# Patient Record
Sex: Female | Born: 1975 | Race: White | Hispanic: No | Marital: Single | Smoking: Never smoker
Health system: Southern US, Community
[De-identification: ages and names within clinical notes are randomized; demographics above are authoritative.]

## PROBLEM LIST (undated history)

## (undated) DIAGNOSIS — J45909 Unspecified asthma, uncomplicated: Secondary | ICD-10-CM

## (undated) DIAGNOSIS — R1011 Right upper quadrant pain: Secondary | ICD-10-CM

## (undated) HISTORY — DX: Unspecified asthma, uncomplicated: J45.909

## (undated) HISTORY — PX: NASAL SINUS SURGERY: SHX719

---

## 2001-12-22 ENCOUNTER — Ambulatory Visit (HOSPITAL_COMMUNITY): Admission: RE | Admit: 2001-12-22 | Discharge: 2001-12-22 | Payer: Self-pay | Admitting: Pulmonary Disease

## 2001-12-22 ENCOUNTER — Encounter: Payer: Self-pay | Admitting: Pulmonary Disease

## 2002-05-28 ENCOUNTER — Encounter: Payer: Self-pay | Admitting: Otolaryngology

## 2002-05-28 ENCOUNTER — Encounter: Admission: RE | Admit: 2002-05-28 | Discharge: 2002-05-28 | Payer: Self-pay | Admitting: Otolaryngology

## 2002-06-14 ENCOUNTER — Ambulatory Visit (HOSPITAL_BASED_OUTPATIENT_CLINIC_OR_DEPARTMENT_OTHER): Admission: RE | Admit: 2002-06-14 | Discharge: 2002-06-14 | Payer: Self-pay | Admitting: Otolaryngology

## 2002-06-14 ENCOUNTER — Encounter (INDEPENDENT_AMBULATORY_CARE_PROVIDER_SITE_OTHER): Payer: Self-pay | Admitting: Specialist

## 2004-06-16 ENCOUNTER — Encounter: Admission: RE | Admit: 2004-06-16 | Discharge: 2004-06-16 | Payer: Self-pay | Admitting: Family Medicine

## 2006-05-30 IMAGING — US US EXTREM LOW VENOUS*L*
1 series · 14 of 24 positions shown · non-contrast
Comparison: none

CLINICAL DATA: Left leg pain and swelling, question DVT.
 ULTRASOUND VENOUS IMAGING, UNILATERAL LEFT:
 The deep veins of the left thigh were evaluated from groin to popliteal fossa using compression grayscale technique, color and pulse Doppler.  The vessels are compressible throughout the course.  There is normal blood flow with color Doppler and normal augmentation.

[Series 1: unknown · 14 of 29 slices shown]
[im 1/29]
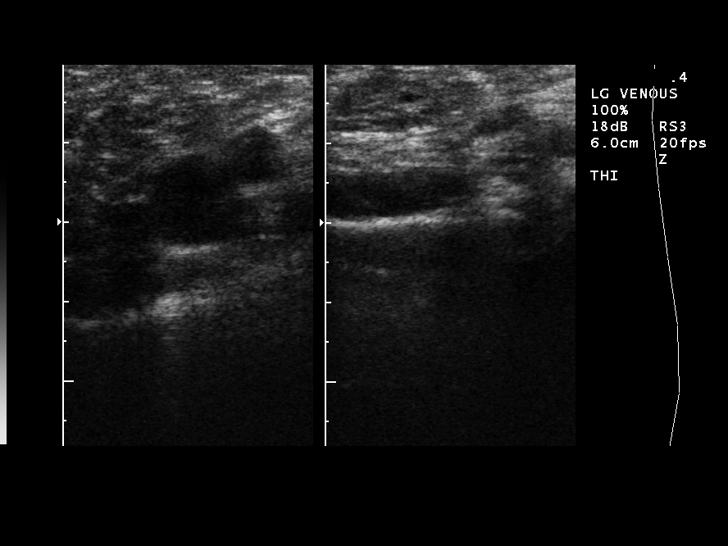
[im 3/29]
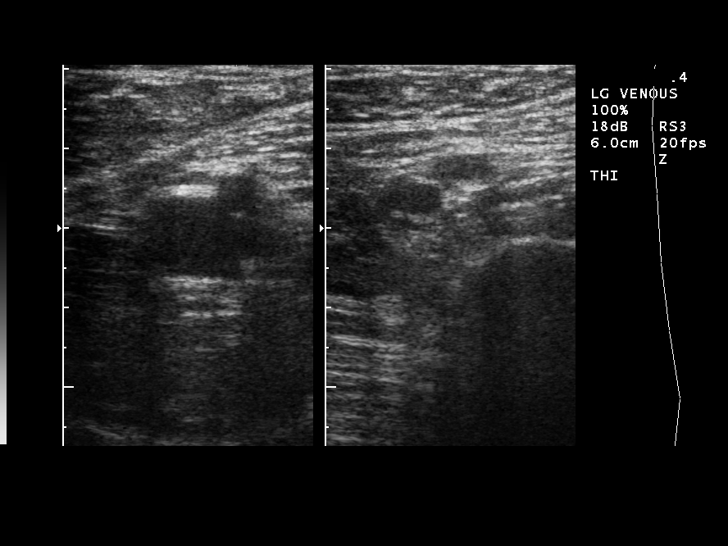
[im 5/29]
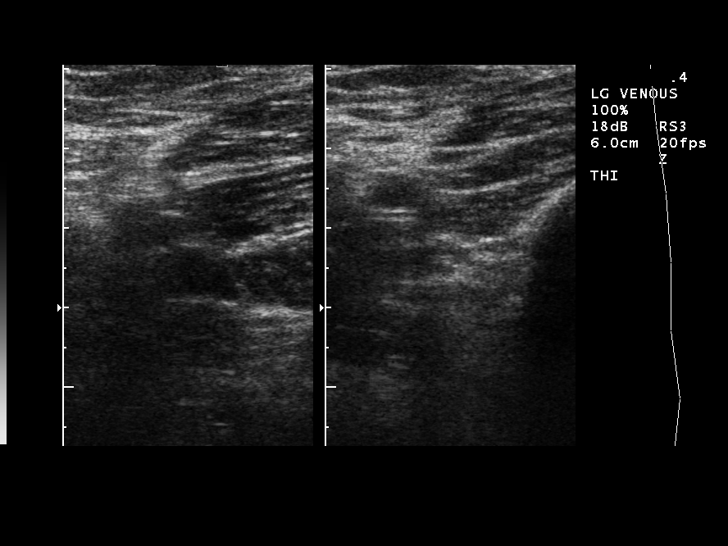
[im 8/29]
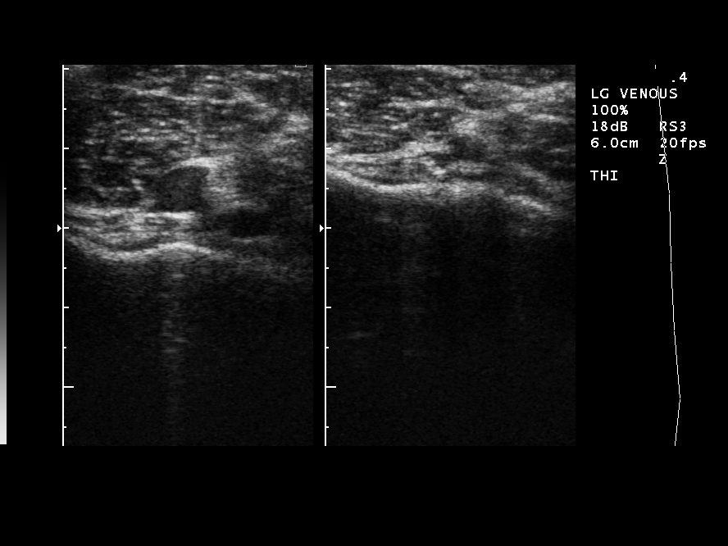
[im 9/29]
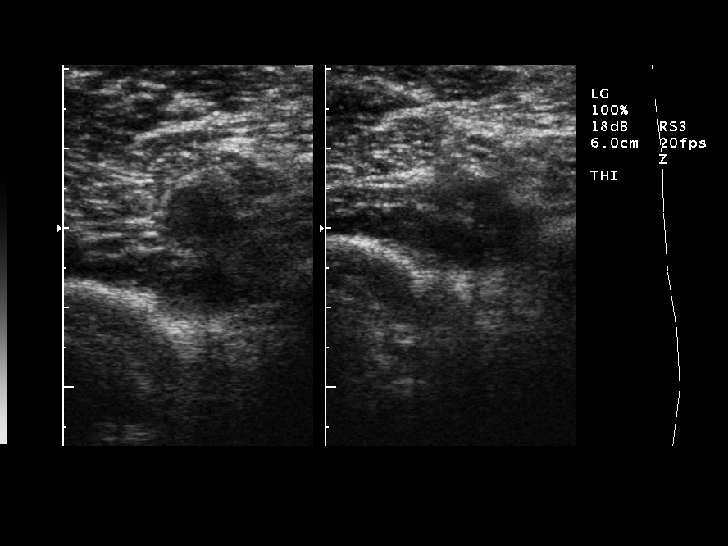
[im 11/29]
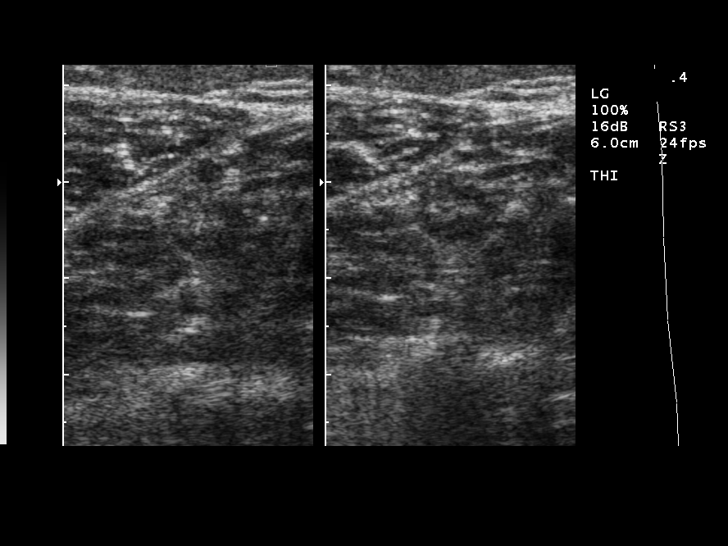
[im 14/29]
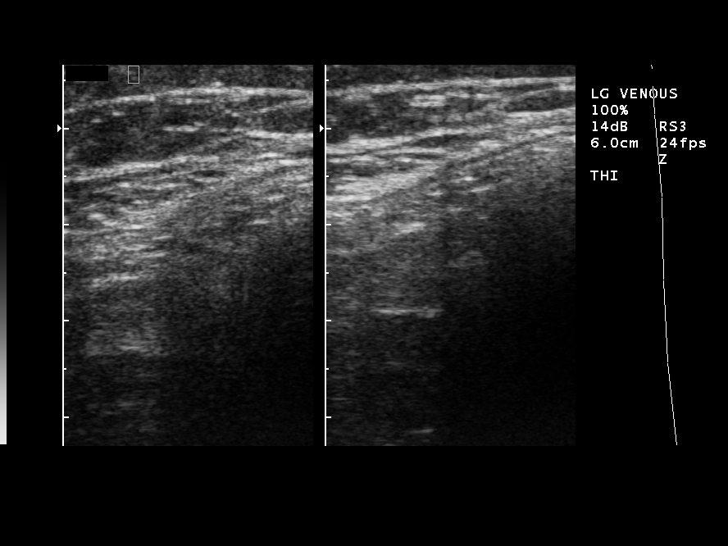
[im 15/29]
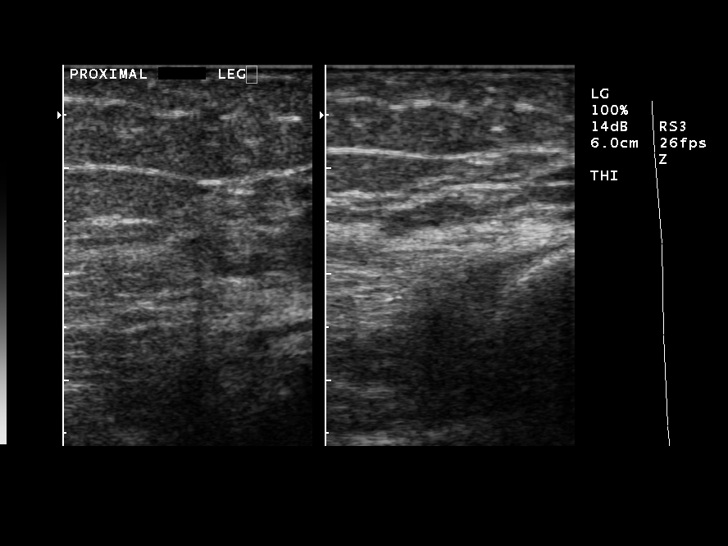
[im 18/29]
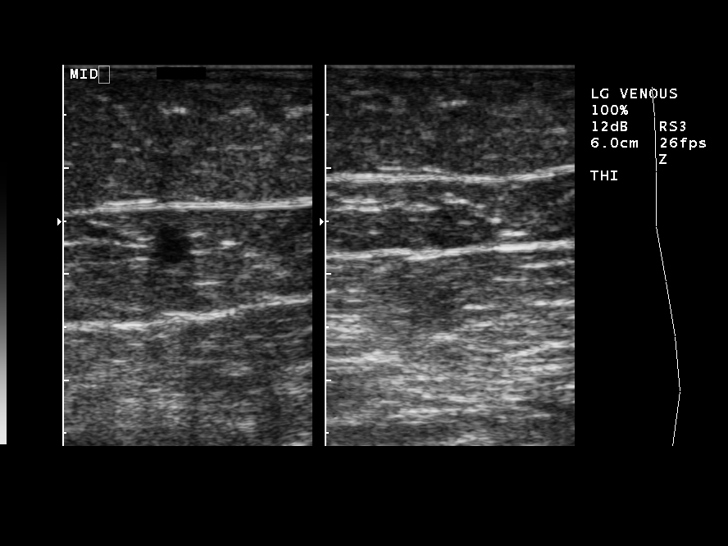
[im 20/29]
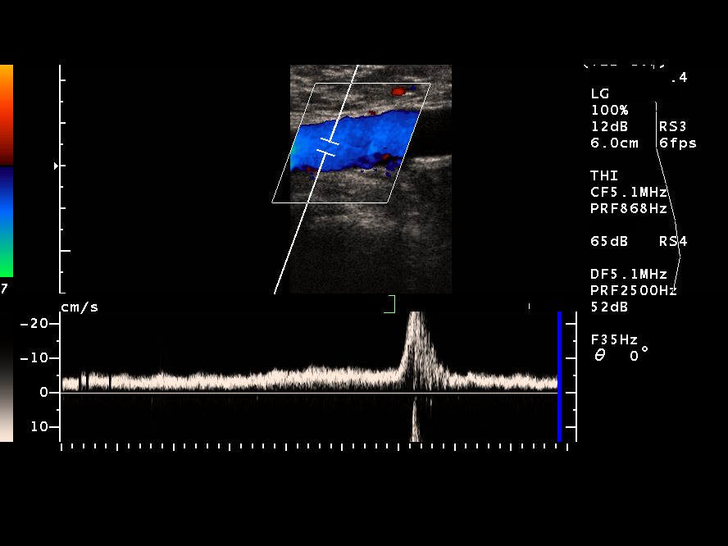
[im 22/29]
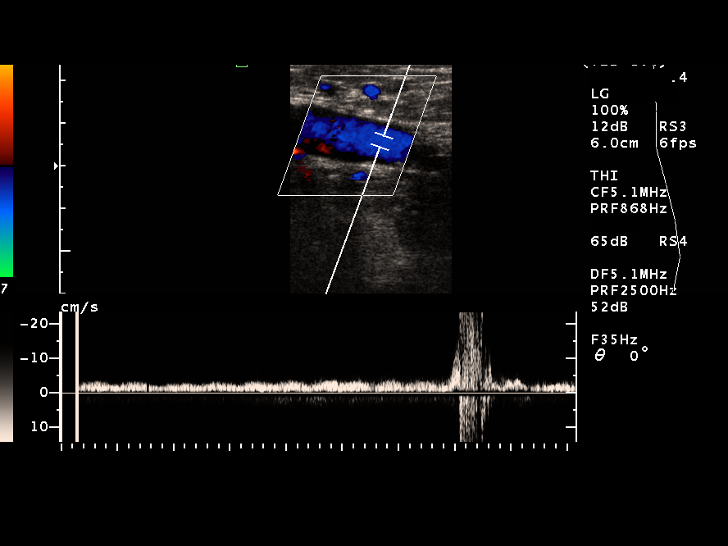
[im 24/29]
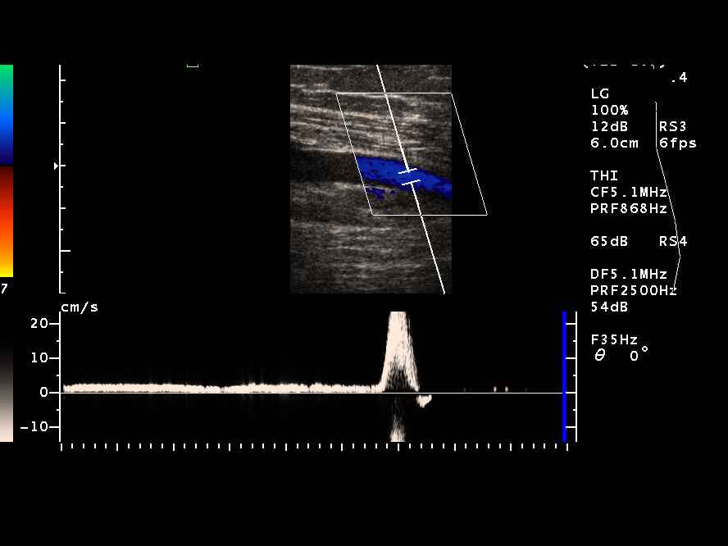
[im 26/29]
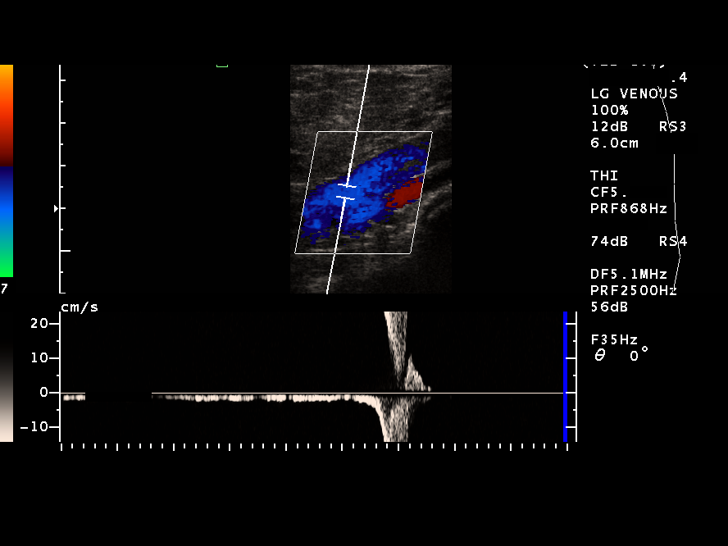
[im 29/29]
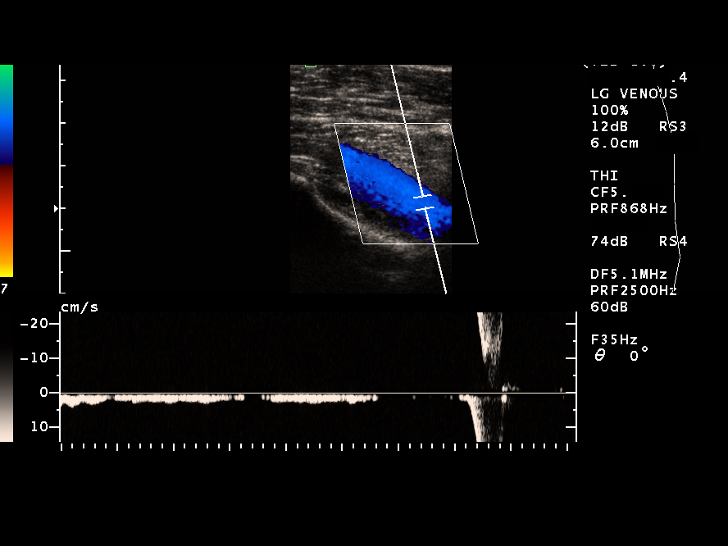

[14 of 24 positions shown; findings below may reference images not displayed]

IMPRESSION: No evidence of acute lower extremity DVT from groin to popliteal fossa on the left.

## 2009-08-21 ENCOUNTER — Ambulatory Visit: Payer: Self-pay | Admitting: Diagnostic Radiology

## 2009-08-21 ENCOUNTER — Emergency Department (HOSPITAL_BASED_OUTPATIENT_CLINIC_OR_DEPARTMENT_OTHER): Admission: EM | Admit: 2009-08-21 | Discharge: 2009-08-21 | Payer: Self-pay | Admitting: Emergency Medicine

## 2010-12-11 NOTE — Op Note (Signed)
Kathleen Jacobs, Kathleen Jacobs                         ACCOUNT NO.:  [id number]   MEDICAL RECORD NO.:  [id number]                   PATIENT TYPE:  AMB   LOCATION:  DSC                                  FACILITY:  [facility]   PHYSICIAN:  Suzanna Obey, M.D.                    DATE OF BIRTH:  07/22/1976   DATE OF PROCEDURE:  DATE OF DISCHARGE:                                 OPERATIVE REPORT   PREOPERATIVE DIAGNOSES:  1. Chronic sinusitis.  2. Deviated septum.  3. Turbinate hypertrophy.   POSTOPERATIVE DIAGNOSES:  1. Chronic sinusitis.  2. Deviated septum.  3. Turbinate hypertrophy.   SURGICAL PROCEDURE:  Septoplasty, submucous resection of inferior  turbinates, bilateral maxillary antrostomy, bilateral ethmoidectomy, right  concha bullosa removal and InstaTrak computer guidance.   ANESTHESIA:  General endotracheal tube.   ESTIMATED BLOOD LOSS:  25 cc.   INDICATIONS FOR PROCEDURE:  This is a 35 year old who has had chronic  problems with asthma and sinus infections.  She has chronic nasal  obstruction.  She has repetitive sinus infections and has become quite  frustrated with the failure to respond.  She has CT scan evidence that she  has bilateral ethmoid disease and maxillary sinus with probably polyps in  the sinus.  She has a severely deviated septum to the right and turbinate  hypertrophy.  She was informed of the risks and benefits of the procedure  including bleeding, infection, perforation, change in the external  appearance of the nose, CSF leak, change in the sense of smell, blindness,  scarring of the sinuses, and risk of the anesthetic.  All questions were  answered and consent was obtained.   DESCRIPTION OF PROCEDURE:  The patient was taken to the operating room and  placed in the supine position after adequate general endotracheal tube  anesthesia.  Was positioned in the supine position and the InstaTrak helmet  was placed.  She was prepped in the usual sterile manner with  draping.  The  oxymetazoline pledgets were placed into the nose bilaterally and then the  septum, inferior turbinate and middle turbinates were injected with 1%  lidocaine with 1:100,000 epinephrine.  A right hemitransfixion incision was  started with raising the mucoperichondrial and ostial flap.  The cartilage  was divided about 2 cm posterior to the caudal strut.  The cartilage and the  dome were severely deviated to the right with a significant spur into the  inferior turbinate.  The cartilage was removed with a Glorious Peach and the bone was  removed with Jansen-Middleton forceps.  The inferior spur was then removed  with a 4 mm osteotome.  This corrected the septal deflection.  The left  sinus was begun using the InstaTrak after calibrated.  The uncinectomy was  performed using a back biting forceps.  The antrostomy was opened widely  with the side biting forceps and the straight Tru-Cut biters.  There was  thickened mucosa in the maxillary sinus.  There were then polyps that were  evident in the opening of the bulla and infundibulum which were removed with  a micro debrider.  The bulla was opened and dissection was carried from  posterior to anterior with thickened tissue throughout the anterior mid  portion of the ethmoid.  InstaTrak was used to guide through this system.  The frontal sinus was identified and was opened.  The pledget was then  placed into the sinus and there was no evidence of any bony dehiscence or  any violation of the borders of the sinus cavity.  The right side was then  performed in a similar manner, but this side had a large concha bullosa and  obstruction of the middle turbinate which was removed with the micro  debrider.  The superior attachment was left in place for anatomic  identification.  Antrostomy was then performed on the right side of the  micro debrider and back biting forceps.  There was thick, mucoid purulent  type material in the maxillary sinus that  was suctioned out, antrostomy  opened widely.  Similar procedure with InstaTrak and micro debrider used to  open up the ethmoids and there was very thickened tissue in the anterior  portion which was removed.  The mucosa was thickened, but there was one area  that had appearance of polyp, but no extensive polyposis.  The frontal sinus  was identified and was opened.  The oxymetazoline pledget was placed into  the sinus.  The hemitransfixion incision was closed with interrupted 4-0  chromic and a quilting 4-0 plain gut placed to the septum.  Turbinates were  then infractured and a midline incision made, and then mucosal flap elevated  superiorly with Therapist, nutritional.  The inferior mucosa and bone were removed  with the turbinate scissors.  The edge was cauterized with suction cautery.  Both turbinates were outfractured.  The Telfa roll was placed into the nose  as well as Kennedy packs placed into both ethmoid cavities.  Both were  soaked with Bacitracin.  The nasopharynx and oral cavity and oropharynx were  suctioned out of all blood and debris under direct visualization.  The packs  were secured with 3-0 nylon.  The Kennedy packs were loosely tied across the  columella.  The patient was awakened and brought to the recovery room in  stable condition, counts correct.                                               Suzanna Obey, M.D.    Cordelia Pen  D:  06/14/2002  T:  06/14/2002  Job:  [id number]   cc:   Danice Goltz, M.D. [facility]  [address] [place], [place] [address]  Fax: 1   Talmadge Coventry, M.D.  [address]  [place]  [place] [address]  Fax: [phone]

## 2011-09-14 ENCOUNTER — Other Ambulatory Visit: Payer: Self-pay | Admitting: Gastroenterology

## 2011-09-14 DIAGNOSIS — R1011 Right upper quadrant pain: Secondary | ICD-10-CM

## 2011-09-17 ENCOUNTER — Ambulatory Visit
Admission: RE | Admit: 2011-09-17 | Discharge: 2011-09-17 | Disposition: A | Discharge status: Home / Self Care | Payer: BC Managed Care – PPO | Source: Ambulatory Visit | Attending: Gastroenterology | Admitting: Gastroenterology

## 2011-09-17 DIAGNOSIS — R1011 Right upper quadrant pain: Secondary | ICD-10-CM

## 2011-10-18 ENCOUNTER — Other Ambulatory Visit (HOSPITAL_COMMUNITY): Payer: Self-pay | Admitting: Gastroenterology

## 2011-10-18 DIAGNOSIS — R1011 Right upper quadrant pain: Secondary | ICD-10-CM

## 2011-10-27 ENCOUNTER — Encounter (HOSPITAL_COMMUNITY): Payer: Self-pay

## 2011-10-27 ENCOUNTER — Encounter (HOSPITAL_COMMUNITY)
Admission: RE | Admit: 2011-10-27 | Discharge: 2011-10-27 | Disposition: A | Discharge status: Home / Self Care | Payer: BC Managed Care – PPO | Source: Ambulatory Visit | Attending: Gastroenterology | Admitting: Gastroenterology

## 2011-10-27 DIAGNOSIS — R109 Unspecified abdominal pain: Secondary | ICD-10-CM | POA: Insufficient documentation

## 2011-10-27 DIAGNOSIS — R1011 Right upper quadrant pain: Secondary | ICD-10-CM

## 2011-10-27 DIAGNOSIS — R110 Nausea: Secondary | ICD-10-CM | POA: Insufficient documentation

## 2011-10-27 HISTORY — DX: Right upper quadrant pain: R10.11

## 2011-10-27 MED ORDER — TECHNETIUM TC 99M MEBROFENIN IV KIT
5.3000 | PACK | Freq: Once | INTRAVENOUS | Status: AC | PRN
  Administered 2011-10-27: 5.3 via INTRAVENOUS

## 2011-10-27 MED ORDER — SINCALIDE 5 MCG IJ SOLR: 0.0200 ug/kg | Freq: Once | INTRAMUSCULAR | Status: DC

## 2012-01-21 ENCOUNTER — Other Ambulatory Visit: Payer: Self-pay | Admitting: Gastroenterology

## 2013-10-09 IMAGING — NM NM HEPATO W/GB/PHARM/[PERSON_NAME]
1 series · 12 of 12 positions shown · non-contrast
Comparison: none

CLINICAL DATA: Right upper abdominal pain, nausea.  Negative
ultrasound.

[Series 1: hepato · 4.46mm/px · 2 acquisitions, 12 frames shown]
[im 1/2]
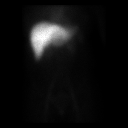
[im 1/2]
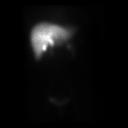
[im 1/2]
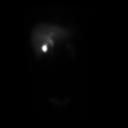
[im 1/2]
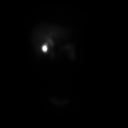
[im 1/2]
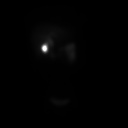
[im 1/2]
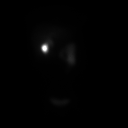
[im 2/2]
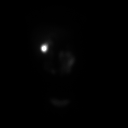
[im 2/2]
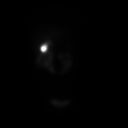
[im 2/2]
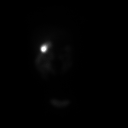
[im 2/2]
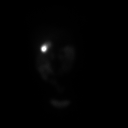
[im 2/2]
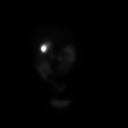
[im 2/2]
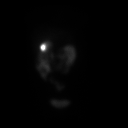

[12 of 12 positions shown; findings below may reference images not displayed]

HEPATOBILIARY SCINTIGRAPHY WITH EJECTION FRACTION

Anterior imaging afterF.OmBi HcFFE Choletec IV. There is prompt
clearance of the radiopharmaceutical from the blood pool. Timely
visualization of activity in central bile ducts, small bowel, and
gallbladder.
After 1 hour,1.2 mcg CCK was infused intravenously and imaging
continued. The patient described pain with infusion. The calculated
>30% at  thirty minutes (Ziessman et al., 3558 J. Nuclear Med).]

IMPRESSION
1. Patency of cystic and common bile ducts.
2. Normal gallbladder ejection fraction.

## 2013-12-06 ENCOUNTER — Other Ambulatory Visit: Payer: Self-pay | Admitting: Physician Assistant

## 2013-12-06 ENCOUNTER — Ambulatory Visit
Admission: RE | Admit: 2013-12-06 | Discharge: 2013-12-06 | Disposition: A | Discharge status: Home / Self Care | Payer: PRIVATE HEALTH INSURANCE | Source: Ambulatory Visit | Attending: Physician Assistant | Admitting: Physician Assistant

## 2013-12-06 DIAGNOSIS — R079 Chest pain, unspecified: Secondary | ICD-10-CM

## 2013-12-06 DIAGNOSIS — M549 Dorsalgia, unspecified: Secondary | ICD-10-CM

## 2015-11-19 IMAGING — CR DG CHEST 2V
2 series · 2 of 2 positions shown · non-contrast
Comparison: Chest x-ray of 08/21/2009

CLINICAL DATA: Chest and left shoulder pain for 2 months

EXAM:
CHEST  2 VIEW

[view not recorded (1 of 2)]
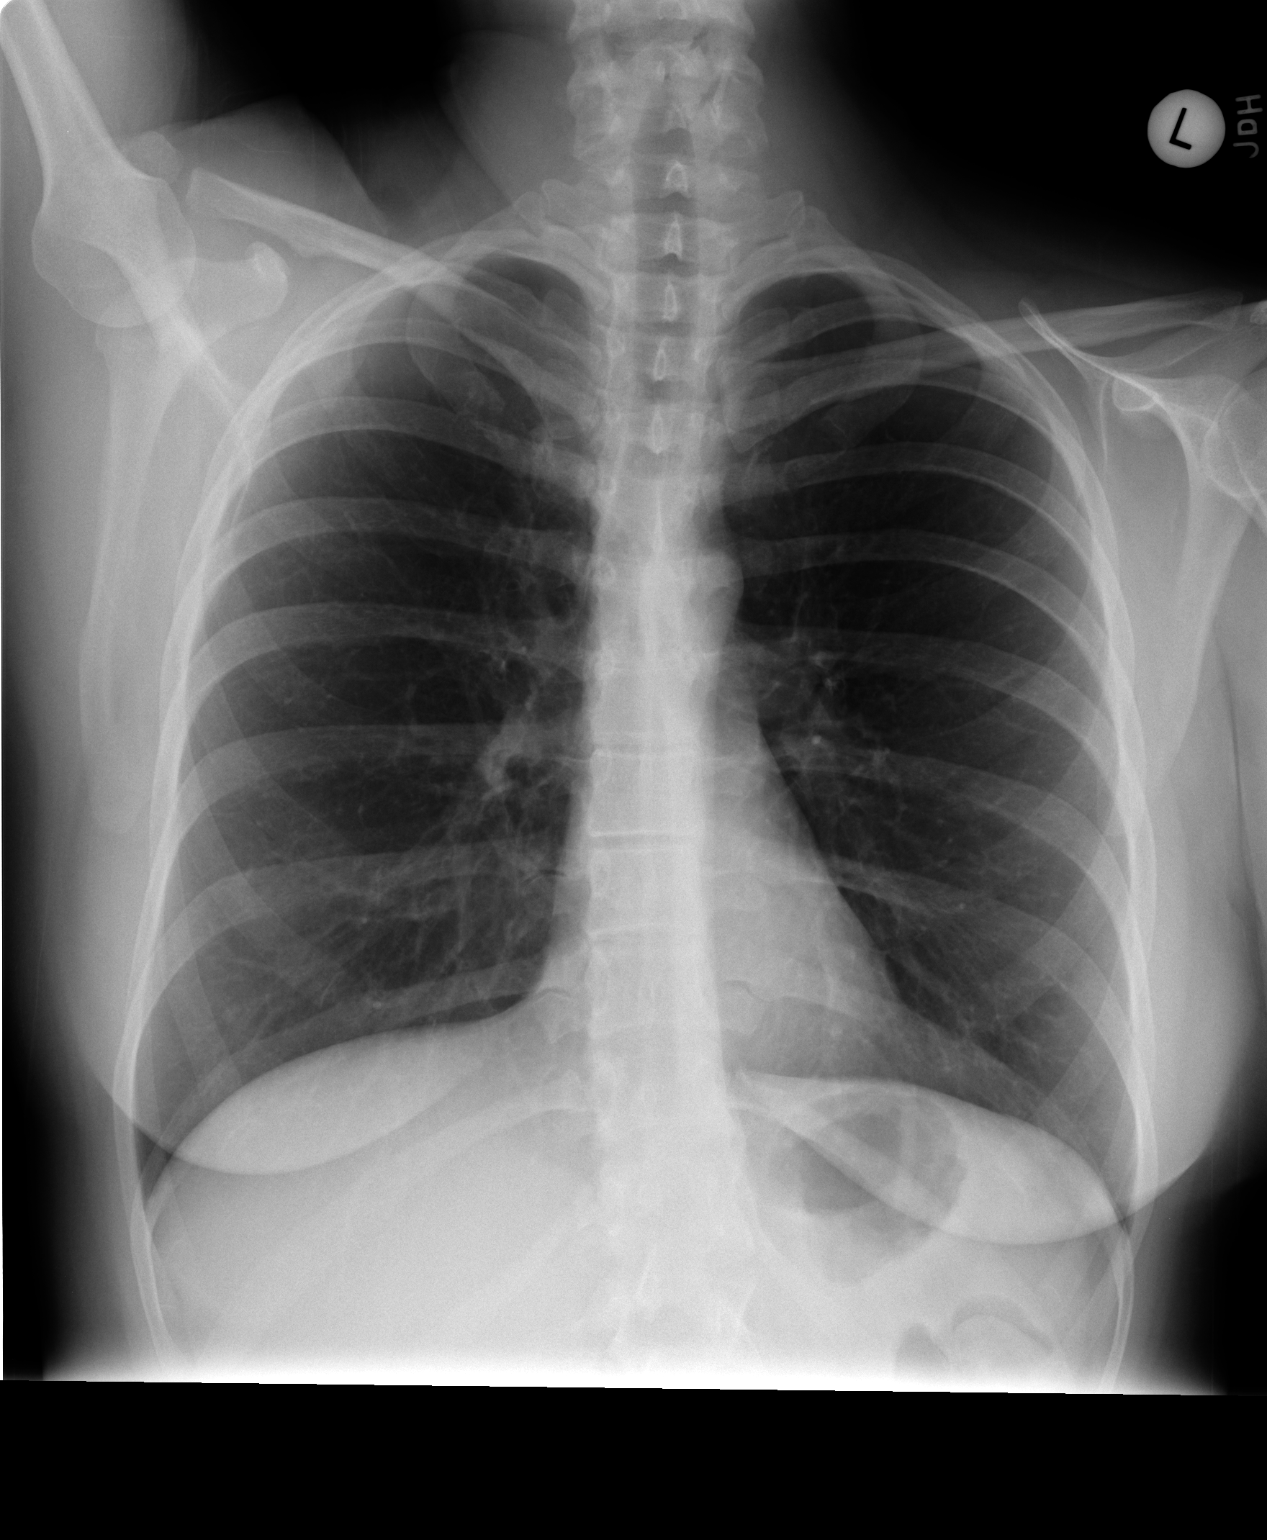

[view not recorded (2 of 2)]
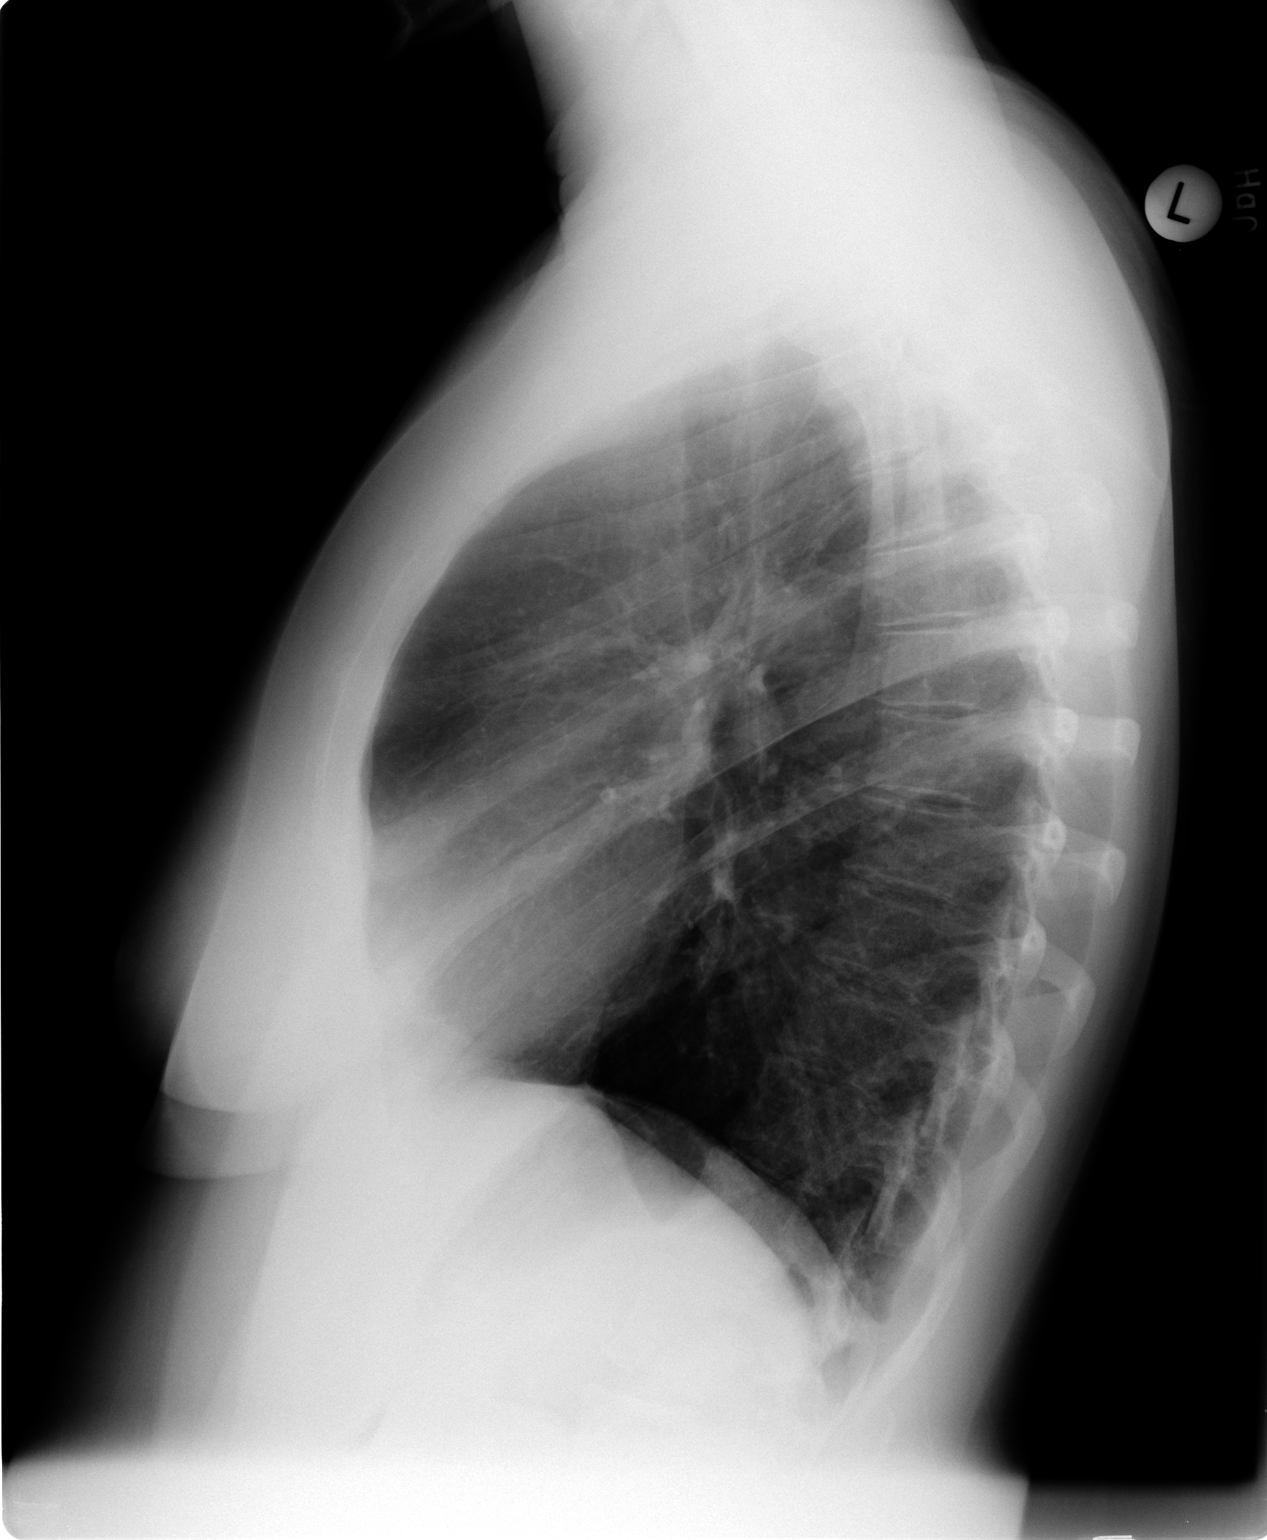

[2 of 2 positions shown; findings below may reference images not displayed]

FINDINGS: The lungs remain slightly hyperaerated. No focal infiltrate or
effusion is seen. Mediastinal contours are unchanged in the heart is
within normal limits in size. No bony abnormality is seen.
IMPRESSION: No active lung disease.

## 2018-05-31 ENCOUNTER — Ambulatory Visit (INDEPENDENT_AMBULATORY_CARE_PROVIDER_SITE_OTHER): Payer: PRIVATE HEALTH INSURANCE | Admitting: Psychology

## 2018-05-31 DIAGNOSIS — F411 Generalized anxiety disorder: Secondary | ICD-10-CM

## 2018-06-15 ENCOUNTER — Ambulatory Visit (INDEPENDENT_AMBULATORY_CARE_PROVIDER_SITE_OTHER): Payer: PRIVATE HEALTH INSURANCE | Admitting: Psychology

## 2018-06-15 DIAGNOSIS — F411 Generalized anxiety disorder: Secondary | ICD-10-CM | POA: Diagnosis not present

## 2018-06-29 ENCOUNTER — Ambulatory Visit (INDEPENDENT_AMBULATORY_CARE_PROVIDER_SITE_OTHER): Payer: PRIVATE HEALTH INSURANCE | Admitting: Psychology

## 2018-06-29 DIAGNOSIS — F411 Generalized anxiety disorder: Secondary | ICD-10-CM | POA: Diagnosis not present

## 2018-07-11 ENCOUNTER — Ambulatory Visit (INDEPENDENT_AMBULATORY_CARE_PROVIDER_SITE_OTHER): Payer: PRIVATE HEALTH INSURANCE | Admitting: Psychology

## 2018-07-11 DIAGNOSIS — F411 Generalized anxiety disorder: Secondary | ICD-10-CM | POA: Diagnosis not present

## 2018-07-24 ENCOUNTER — Ambulatory Visit (INDEPENDENT_AMBULATORY_CARE_PROVIDER_SITE_OTHER): Payer: PRIVATE HEALTH INSURANCE | Admitting: Psychology

## 2018-07-24 DIAGNOSIS — F411 Generalized anxiety disorder: Secondary | ICD-10-CM

## 2018-08-09 ENCOUNTER — Ambulatory Visit (INDEPENDENT_AMBULATORY_CARE_PROVIDER_SITE_OTHER): Payer: PRIVATE HEALTH INSURANCE | Admitting: Psychology

## 2018-08-09 DIAGNOSIS — F411 Generalized anxiety disorder: Secondary | ICD-10-CM | POA: Diagnosis not present

## 2018-08-30 ENCOUNTER — Ambulatory Visit (INDEPENDENT_AMBULATORY_CARE_PROVIDER_SITE_OTHER): Payer: PRIVATE HEALTH INSURANCE | Admitting: Psychology

## 2018-08-30 DIAGNOSIS — F411 Generalized anxiety disorder: Secondary | ICD-10-CM | POA: Diagnosis not present

## 2018-09-19 ENCOUNTER — Ambulatory Visit: Payer: PRIVATE HEALTH INSURANCE | Admitting: Psychology

## 2018-10-04 ENCOUNTER — Other Ambulatory Visit: Payer: Self-pay

## 2018-10-04 ENCOUNTER — Ambulatory Visit (INDEPENDENT_AMBULATORY_CARE_PROVIDER_SITE_OTHER): Payer: PRIVATE HEALTH INSURANCE | Admitting: Psychology

## 2018-10-04 DIAGNOSIS — F411 Generalized anxiety disorder: Secondary | ICD-10-CM

## 2018-10-17 ENCOUNTER — Ambulatory Visit (INDEPENDENT_AMBULATORY_CARE_PROVIDER_SITE_OTHER): Payer: PRIVATE HEALTH INSURANCE | Admitting: Psychology

## 2018-10-17 DIAGNOSIS — F411 Generalized anxiety disorder: Secondary | ICD-10-CM | POA: Diagnosis not present

## 2018-11-01 ENCOUNTER — Ambulatory Visit (INDEPENDENT_AMBULATORY_CARE_PROVIDER_SITE_OTHER): Payer: Commercial Managed Care - PPO | Admitting: Psychology

## 2018-11-01 DIAGNOSIS — F411 Generalized anxiety disorder: Secondary | ICD-10-CM | POA: Diagnosis not present

## 2018-11-21 ENCOUNTER — Ambulatory Visit (INDEPENDENT_AMBULATORY_CARE_PROVIDER_SITE_OTHER): Payer: Commercial Managed Care - PPO | Admitting: Psychology

## 2018-11-21 DIAGNOSIS — F411 Generalized anxiety disorder: Secondary | ICD-10-CM

## 2018-12-13 ENCOUNTER — Ambulatory Visit (INDEPENDENT_AMBULATORY_CARE_PROVIDER_SITE_OTHER): Payer: Commercial Managed Care - PPO | Admitting: Psychology

## 2018-12-13 DIAGNOSIS — F411 Generalized anxiety disorder: Secondary | ICD-10-CM

## 2018-12-27 ENCOUNTER — Ambulatory Visit (INDEPENDENT_AMBULATORY_CARE_PROVIDER_SITE_OTHER): Payer: Commercial Managed Care - PPO | Admitting: Psychology

## 2018-12-27 DIAGNOSIS — F411 Generalized anxiety disorder: Secondary | ICD-10-CM | POA: Diagnosis not present

## 2019-01-16 ENCOUNTER — Ambulatory Visit (INDEPENDENT_AMBULATORY_CARE_PROVIDER_SITE_OTHER): Payer: Commercial Managed Care - PPO | Admitting: Psychology

## 2019-01-16 DIAGNOSIS — F411 Generalized anxiety disorder: Secondary | ICD-10-CM | POA: Diagnosis not present

## 2019-02-05 ENCOUNTER — Ambulatory Visit (INDEPENDENT_AMBULATORY_CARE_PROVIDER_SITE_OTHER): Payer: Commercial Managed Care - PPO | Admitting: Psychology

## 2019-02-05 DIAGNOSIS — F411 Generalized anxiety disorder: Secondary | ICD-10-CM | POA: Diagnosis not present

## 2019-02-22 ENCOUNTER — Ambulatory Visit (INDEPENDENT_AMBULATORY_CARE_PROVIDER_SITE_OTHER): Payer: Commercial Managed Care - PPO | Admitting: Psychology

## 2019-02-22 DIAGNOSIS — F411 Generalized anxiety disorder: Secondary | ICD-10-CM

## 2019-03-07 ENCOUNTER — Ambulatory Visit (INDEPENDENT_AMBULATORY_CARE_PROVIDER_SITE_OTHER): Payer: Commercial Managed Care - PPO | Admitting: Psychology

## 2019-03-07 DIAGNOSIS — F411 Generalized anxiety disorder: Secondary | ICD-10-CM | POA: Diagnosis not present

## 2019-04-05 ENCOUNTER — Ambulatory Visit: Payer: Commercial Managed Care - PPO | Admitting: Psychology

## 2021-09-07 DIAGNOSIS — R200 Anesthesia of skin: Secondary | ICD-10-CM | POA: Diagnosis not present

## 2021-09-07 DIAGNOSIS — I7300 Raynaud's syndrome without gangrene: Secondary | ICD-10-CM | POA: Diagnosis not present

## 2021-09-25 DIAGNOSIS — I7300 Raynaud's syndrome without gangrene: Secondary | ICD-10-CM | POA: Diagnosis not present

## 2021-09-25 DIAGNOSIS — R768 Other specified abnormal immunological findings in serum: Secondary | ICD-10-CM | POA: Diagnosis not present

## 2021-09-25 DIAGNOSIS — M359 Systemic involvement of connective tissue, unspecified: Secondary | ICD-10-CM | POA: Diagnosis not present

## 2021-09-25 DIAGNOSIS — R202 Paresthesia of skin: Secondary | ICD-10-CM | POA: Diagnosis not present

## 2021-09-30 DIAGNOSIS — Z01419 Encounter for gynecological examination (general) (routine) without abnormal findings: Secondary | ICD-10-CM | POA: Diagnosis not present

## 2021-09-30 DIAGNOSIS — Z1151 Encounter for screening for human papillomavirus (HPV): Secondary | ICD-10-CM | POA: Diagnosis not present

## 2021-10-28 DIAGNOSIS — R768 Other specified abnormal immunological findings in serum: Secondary | ICD-10-CM | POA: Diagnosis not present

## 2021-10-28 DIAGNOSIS — E559 Vitamin D deficiency, unspecified: Secondary | ICD-10-CM | POA: Diagnosis not present

## 2021-10-28 DIAGNOSIS — R202 Paresthesia of skin: Secondary | ICD-10-CM | POA: Diagnosis not present

## 2021-10-28 DIAGNOSIS — I7300 Raynaud's syndrome without gangrene: Secondary | ICD-10-CM | POA: Diagnosis not present

## 2021-11-03 DIAGNOSIS — J4540 Moderate persistent asthma, uncomplicated: Secondary | ICD-10-CM | POA: Diagnosis not present

## 2021-11-03 DIAGNOSIS — F900 Attention-deficit hyperactivity disorder, predominantly inattentive type: Secondary | ICD-10-CM | POA: Diagnosis not present

## 2021-11-03 DIAGNOSIS — E7800 Pure hypercholesterolemia, unspecified: Secondary | ICD-10-CM | POA: Diagnosis not present

## 2021-11-05 DIAGNOSIS — Z1239 Encounter for other screening for malignant neoplasm of breast: Secondary | ICD-10-CM | POA: Diagnosis not present

## 2021-11-05 DIAGNOSIS — Z1231 Encounter for screening mammogram for malignant neoplasm of breast: Secondary | ICD-10-CM | POA: Diagnosis not present

## 2022-05-06 DIAGNOSIS — R768 Other specified abnormal immunological findings in serum: Secondary | ICD-10-CM | POA: Diagnosis not present

## 2022-05-06 DIAGNOSIS — M419 Scoliosis, unspecified: Secondary | ICD-10-CM | POA: Diagnosis not present

## 2022-05-06 DIAGNOSIS — R200 Anesthesia of skin: Secondary | ICD-10-CM | POA: Diagnosis not present

## 2022-05-06 DIAGNOSIS — R208 Other disturbances of skin sensation: Secondary | ICD-10-CM | POA: Diagnosis not present

## 2022-05-06 DIAGNOSIS — I7300 Raynaud's syndrome without gangrene: Secondary | ICD-10-CM | POA: Diagnosis not present

## 2022-05-10 DIAGNOSIS — J45909 Unspecified asthma, uncomplicated: Secondary | ICD-10-CM | POA: Diagnosis not present

## 2022-05-10 DIAGNOSIS — Z23 Encounter for immunization: Secondary | ICD-10-CM | POA: Diagnosis not present

## 2022-05-10 DIAGNOSIS — F900 Attention-deficit hyperactivity disorder, predominantly inattentive type: Secondary | ICD-10-CM | POA: Diagnosis not present

## 2022-05-10 DIAGNOSIS — E7800 Pure hypercholesterolemia, unspecified: Secondary | ICD-10-CM | POA: Diagnosis not present

## 2022-05-10 DIAGNOSIS — E559 Vitamin D deficiency, unspecified: Secondary | ICD-10-CM | POA: Diagnosis not present

## 2022-05-10 DIAGNOSIS — K200 Eosinophilic esophagitis: Secondary | ICD-10-CM | POA: Diagnosis not present

## 2022-05-10 DIAGNOSIS — Z0000 Encounter for general adult medical examination without abnormal findings: Secondary | ICD-10-CM | POA: Diagnosis not present

## 2022-05-10 DIAGNOSIS — M2550 Pain in unspecified joint: Secondary | ICD-10-CM | POA: Diagnosis not present

## 2022-05-27 DIAGNOSIS — R768 Other specified abnormal immunological findings in serum: Secondary | ICD-10-CM | POA: Diagnosis not present

## 2022-05-27 DIAGNOSIS — Z79899 Other long term (current) drug therapy: Secondary | ICD-10-CM | POA: Diagnosis not present

## 2022-05-27 DIAGNOSIS — I7300 Raynaud's syndrome without gangrene: Secondary | ICD-10-CM | POA: Diagnosis not present

## 2022-05-27 DIAGNOSIS — R200 Anesthesia of skin: Secondary | ICD-10-CM | POA: Diagnosis not present

## 2022-09-09 DIAGNOSIS — R200 Anesthesia of skin: Secondary | ICD-10-CM | POA: Diagnosis not present

## 2022-09-09 DIAGNOSIS — R768 Other specified abnormal immunological findings in serum: Secondary | ICD-10-CM | POA: Diagnosis not present

## 2022-09-09 DIAGNOSIS — Z79899 Other long term (current) drug therapy: Secondary | ICD-10-CM | POA: Diagnosis not present

## 2022-09-09 DIAGNOSIS — I7300 Raynaud's syndrome without gangrene: Secondary | ICD-10-CM | POA: Diagnosis not present

## 2022-10-06 DIAGNOSIS — N760 Acute vaginitis: Secondary | ICD-10-CM | POA: Diagnosis not present

## 2022-10-06 DIAGNOSIS — Z1151 Encounter for screening for human papillomavirus (HPV): Secondary | ICD-10-CM | POA: Diagnosis not present

## 2022-10-06 DIAGNOSIS — B9689 Other specified bacterial agents as the cause of diseases classified elsewhere: Secondary | ICD-10-CM | POA: Diagnosis not present

## 2022-10-06 DIAGNOSIS — Z01411 Encounter for gynecological examination (general) (routine) with abnormal findings: Secondary | ICD-10-CM | POA: Diagnosis not present

## 2022-10-06 DIAGNOSIS — Z01419 Encounter for gynecological examination (general) (routine) without abnormal findings: Secondary | ICD-10-CM | POA: Diagnosis not present

## 2022-11-08 DIAGNOSIS — Z1231 Encounter for screening mammogram for malignant neoplasm of breast: Secondary | ICD-10-CM | POA: Diagnosis not present

## 2022-11-09 DIAGNOSIS — F900 Attention-deficit hyperactivity disorder, predominantly inattentive type: Secondary | ICD-10-CM | POA: Diagnosis not present

## 2022-11-09 DIAGNOSIS — J4540 Moderate persistent asthma, uncomplicated: Secondary | ICD-10-CM | POA: Diagnosis not present

## 2022-12-08 ENCOUNTER — Encounter: Payer: Self-pay | Admitting: Pulmonary Disease

## 2022-12-08 ENCOUNTER — Ambulatory Visit (INDEPENDENT_AMBULATORY_CARE_PROVIDER_SITE_OTHER): Payer: BC Managed Care – PPO | Admitting: Pulmonary Disease

## 2022-12-08 VITALS — BP 118/80 | HR 104 | Ht 61.0 in | Wt 151.0 lb

## 2022-12-08 DIAGNOSIS — J4540 Moderate persistent asthma, uncomplicated: Secondary | ICD-10-CM | POA: Diagnosis not present

## 2022-12-08 NOTE — Patient Instructions (Signed)
I will suggest that you continue your Wixela at the current dose, as long as it continues to control your asthma well enough  If at any point in the year you feel you want to stepdown to Wixela 250 instead of the 500, that can be facilitated  Exposure to triggers will lead to symptoms regardless of level of control Continue to avoid triggers as able  I will see you a year from now  Call with significant concerns

## 2022-12-08 NOTE — Progress Notes (Signed)
Kathleen Jacobs    [id number]    04-Mar-1976  Primary Care Physician:Smith, Sonny Masters, MD  Referring Physician: Merri Brunette, MD [phone] [place],  [place] [address]  Chief complaint:   Patient being seen for asthma  HPI:  Patient with longstanding asthma Controlled on Wixela 500  Recently has needed to use rescue inhaler more frequently secondary to weather changes Has multiple triggers, multiple sensitivities including weather changes, damp moist weather, exposure to mold, exposure to dust fumes, exposure to animal dander although set up for asthma  Last time she had a severe exacerbation needing steroids was in 2020  Asthma is usually well-controlled  Never smoker  No pertinent occupational history  Occasionally does take Singulair-sometimes limited by side effects   Outpatient Encounter Medications as of 12/08/2022  Medication Sig   albuterol (PROVENTIL) (2.5 MG/3ML) 0.083% nebulizer solution VVN Q 4 TO 6 H PRN UTD FOR 30 DAYS   albuterol (VENTOLIN HFA) 108 (90 Base) MCG/ACT inhaler SMARTSIG:1 Puff(s) By Mouth Every 4 Hours PRN   ALPRAZolam (XANAX) 0.25 MG tablet 1 tablet Orally Twice a day as needed for anxiousness for 30 days   amLODipine (NORVASC) 2.5 MG tablet Take 1 tablet by mouth daily.   amphetamine-dextroamphetamine (ADDERALL) 10 MG tablet Take 10 mg by mouth daily as needed.   EPINEPHrine (EPIPEN JR) 0.15 MG/0.3ML injection Inject into the muscle.   hydroxychloroquine (PLAQUENIL) 200 MG tablet Take 300 mg by mouth daily.   montelukast (SINGULAIR) 10 MG tablet TK 1 T PO QD IN THE EVE   SIMPESSE 0.15-0.03 &0.01 MG tablet Take 1 tablet by mouth daily.   triamcinolone cream (KENALOG) 0.1 % APP SPARINGLY EXT AA BID PRN   VYVANSE 60 MG capsule Take 60 mg by mouth every morning.   WIXELA INHUB 500-50 MCG/ACT AEPB SMARTSIG:1 Puff(s) By Mouth Every 12 Hours   No facility-administered encounter medications on file as of 12/08/2022.     Allergies as of 12/08/2022 - Review Complete 12/08/2022  Allergen Reaction Noted   Atomoxetine Other (See Comments) 09/17/2015    Past Medical History:  Diagnosis Date   Asthma    RUQ pain     Past Surgical History:  Procedure Laterality Date   NASAL SINUS SURGERY      Family History  Problem Relation Age of Onset   Heart disease Father     Social History   Socioeconomic History   Marital status: Single    Spouse name: Not on file   Number of children: Not on file   Years of education: Not on file   Highest education level: Not on file  Occupational History   Not on file  Tobacco Use   Smoking status: Never   Smokeless tobacco: Never  Substance and Sexual Activity   Alcohol use: Not Currently   Drug use: Not Currently   Sexual activity: Not Currently  Other Topics Concern   Not on file  Social History Narrative   Not on file   Social Determinants of Health   Financial Resource Strain: Not on file  Food Insecurity: Not on file  Transportation Needs: Not on file  Physical Activity: Not on file  Stress: Not on file  Social Connections: Not on file  Intimate Partner Violence: Not on file    Review of Systems  Respiratory:  Negative for shortness of breath and wheezing.     Vitals:   12/08/22 1325  BP: 118/80  Pulse: (!) 104  SpO2: 100%     Physical Exam Constitutional:      Appearance: Normal appearance.  HENT:     Head: Normocephalic.     Mouth/Throat:     Mouth: Mucous membranes are moist.  Eyes:     General: No scleral icterus. Cardiovascular:     Rate and Rhythm: Normal rate and regular rhythm.     Heart sounds: No murmur heard.    No friction rub.  Pulmonary:     Effort: No respiratory distress.     Breath sounds: No stridor. No wheezing or rhonchi.  Musculoskeletal:     Cervical back: No rigidity or tenderness.  Neurological:     Mental Status: She is alert.  Psychiatric:        Mood and Affect: Mood normal.     Data  Reviewed: No recent chest x-rays  No PFTs on record  Assessment:  Long-term moderate persistent asthma  Increased use of albuterol recently secondary to weather changes which she knows she is sensitive to  Symptoms usually controlled with Wixela 500 which she has been on for a while  Has multiple triggers  Plan/Recommendations: Continue Wixela 500  May consider de-escalation to [facility] 250 during the times of the year when symptoms are better controlled  No real indication for any further testing at present  No reason to escalate treatment now  If treatment escalation is needed addition of anticholinergic may be considered  Call us with any significant concerns   Virl Diamond MD [facility] 12/08/2022, 1:42 PM  CC: Merri Brunette, MD

## 2023-05-13 DIAGNOSIS — I7300 Raynaud's syndrome without gangrene: Secondary | ICD-10-CM | POA: Diagnosis not present

## 2023-05-13 DIAGNOSIS — E7800 Pure hypercholesterolemia, unspecified: Secondary | ICD-10-CM | POA: Diagnosis not present

## 2023-05-23 DIAGNOSIS — Z23 Encounter for immunization: Secondary | ICD-10-CM | POA: Diagnosis not present

## 2023-05-23 DIAGNOSIS — J4540 Moderate persistent asthma, uncomplicated: Secondary | ICD-10-CM | POA: Diagnosis not present

## 2023-05-23 DIAGNOSIS — K200 Eosinophilic esophagitis: Secondary | ICD-10-CM | POA: Diagnosis not present

## 2023-05-23 DIAGNOSIS — F900 Attention-deficit hyperactivity disorder, predominantly inattentive type: Secondary | ICD-10-CM | POA: Diagnosis not present

## 2023-05-23 DIAGNOSIS — Z0000 Encounter for general adult medical examination without abnormal findings: Secondary | ICD-10-CM | POA: Diagnosis not present

## 2023-05-23 DIAGNOSIS — F419 Anxiety disorder, unspecified: Secondary | ICD-10-CM | POA: Diagnosis not present

## 2023-08-02 DIAGNOSIS — R200 Anesthesia of skin: Secondary | ICD-10-CM | POA: Diagnosis not present

## 2023-08-02 DIAGNOSIS — Z79899 Other long term (current) drug therapy: Secondary | ICD-10-CM | POA: Diagnosis not present

## 2023-08-02 DIAGNOSIS — I7300 Raynaud's syndrome without gangrene: Secondary | ICD-10-CM | POA: Diagnosis not present

## 2023-08-02 DIAGNOSIS — R768 Other specified abnormal immunological findings in serum: Secondary | ICD-10-CM | POA: Diagnosis not present

## 2023-10-03 DIAGNOSIS — M9905 Segmental and somatic dysfunction of pelvic region: Secondary | ICD-10-CM | POA: Diagnosis not present

## 2023-10-03 DIAGNOSIS — M9902 Segmental and somatic dysfunction of thoracic region: Secondary | ICD-10-CM | POA: Diagnosis not present

## 2023-10-03 DIAGNOSIS — M9901 Segmental and somatic dysfunction of cervical region: Secondary | ICD-10-CM | POA: Diagnosis not present

## 2023-10-03 DIAGNOSIS — M9903 Segmental and somatic dysfunction of lumbar region: Secondary | ICD-10-CM | POA: Diagnosis not present

## 2023-10-04 DIAGNOSIS — M9901 Segmental and somatic dysfunction of cervical region: Secondary | ICD-10-CM | POA: Diagnosis not present

## 2023-10-04 DIAGNOSIS — M9905 Segmental and somatic dysfunction of pelvic region: Secondary | ICD-10-CM | POA: Diagnosis not present

## 2023-10-04 DIAGNOSIS — M9903 Segmental and somatic dysfunction of lumbar region: Secondary | ICD-10-CM | POA: Diagnosis not present

## 2023-10-04 DIAGNOSIS — M9902 Segmental and somatic dysfunction of thoracic region: Secondary | ICD-10-CM | POA: Diagnosis not present

## 2023-10-06 DIAGNOSIS — M9902 Segmental and somatic dysfunction of thoracic region: Secondary | ICD-10-CM | POA: Diagnosis not present

## 2023-10-06 DIAGNOSIS — M9905 Segmental and somatic dysfunction of pelvic region: Secondary | ICD-10-CM | POA: Diagnosis not present

## 2023-10-06 DIAGNOSIS — M9903 Segmental and somatic dysfunction of lumbar region: Secondary | ICD-10-CM | POA: Diagnosis not present

## 2023-10-06 DIAGNOSIS — M9901 Segmental and somatic dysfunction of cervical region: Secondary | ICD-10-CM | POA: Diagnosis not present

## 2023-10-07 DIAGNOSIS — M9905 Segmental and somatic dysfunction of pelvic region: Secondary | ICD-10-CM | POA: Diagnosis not present

## 2023-10-07 DIAGNOSIS — M9902 Segmental and somatic dysfunction of thoracic region: Secondary | ICD-10-CM | POA: Diagnosis not present

## 2023-10-07 DIAGNOSIS — M9901 Segmental and somatic dysfunction of cervical region: Secondary | ICD-10-CM | POA: Diagnosis not present

## 2023-10-07 DIAGNOSIS — M9903 Segmental and somatic dysfunction of lumbar region: Secondary | ICD-10-CM | POA: Diagnosis not present

## 2023-10-10 DIAGNOSIS — M9905 Segmental and somatic dysfunction of pelvic region: Secondary | ICD-10-CM | POA: Diagnosis not present

## 2023-10-10 DIAGNOSIS — M9902 Segmental and somatic dysfunction of thoracic region: Secondary | ICD-10-CM | POA: Diagnosis not present

## 2023-10-10 DIAGNOSIS — M9901 Segmental and somatic dysfunction of cervical region: Secondary | ICD-10-CM | POA: Diagnosis not present

## 2023-10-10 DIAGNOSIS — M9903 Segmental and somatic dysfunction of lumbar region: Secondary | ICD-10-CM | POA: Diagnosis not present

## 2023-10-13 DIAGNOSIS — M9905 Segmental and somatic dysfunction of pelvic region: Secondary | ICD-10-CM | POA: Diagnosis not present

## 2023-10-13 DIAGNOSIS — M9903 Segmental and somatic dysfunction of lumbar region: Secondary | ICD-10-CM | POA: Diagnosis not present

## 2023-10-13 DIAGNOSIS — M9901 Segmental and somatic dysfunction of cervical region: Secondary | ICD-10-CM | POA: Diagnosis not present

## 2023-10-13 DIAGNOSIS — M9902 Segmental and somatic dysfunction of thoracic region: Secondary | ICD-10-CM | POA: Diagnosis not present

## 2023-10-14 DIAGNOSIS — M9905 Segmental and somatic dysfunction of pelvic region: Secondary | ICD-10-CM | POA: Diagnosis not present

## 2023-10-14 DIAGNOSIS — M9903 Segmental and somatic dysfunction of lumbar region: Secondary | ICD-10-CM | POA: Diagnosis not present

## 2023-10-14 DIAGNOSIS — M9901 Segmental and somatic dysfunction of cervical region: Secondary | ICD-10-CM | POA: Diagnosis not present

## 2023-10-14 DIAGNOSIS — M9902 Segmental and somatic dysfunction of thoracic region: Secondary | ICD-10-CM | POA: Diagnosis not present

## 2023-10-17 DIAGNOSIS — M9901 Segmental and somatic dysfunction of cervical region: Secondary | ICD-10-CM | POA: Diagnosis not present

## 2023-10-17 DIAGNOSIS — M9903 Segmental and somatic dysfunction of lumbar region: Secondary | ICD-10-CM | POA: Diagnosis not present

## 2023-10-17 DIAGNOSIS — M9905 Segmental and somatic dysfunction of pelvic region: Secondary | ICD-10-CM | POA: Diagnosis not present

## 2023-10-17 DIAGNOSIS — M9902 Segmental and somatic dysfunction of thoracic region: Secondary | ICD-10-CM | POA: Diagnosis not present

## 2023-10-20 DIAGNOSIS — M9905 Segmental and somatic dysfunction of pelvic region: Secondary | ICD-10-CM | POA: Diagnosis not present

## 2023-10-20 DIAGNOSIS — M9901 Segmental and somatic dysfunction of cervical region: Secondary | ICD-10-CM | POA: Diagnosis not present

## 2023-10-20 DIAGNOSIS — M9902 Segmental and somatic dysfunction of thoracic region: Secondary | ICD-10-CM | POA: Diagnosis not present

## 2023-10-20 DIAGNOSIS — M9903 Segmental and somatic dysfunction of lumbar region: Secondary | ICD-10-CM | POA: Diagnosis not present

## 2023-10-21 DIAGNOSIS — M9905 Segmental and somatic dysfunction of pelvic region: Secondary | ICD-10-CM | POA: Diagnosis not present

## 2023-10-21 DIAGNOSIS — M9901 Segmental and somatic dysfunction of cervical region: Secondary | ICD-10-CM | POA: Diagnosis not present

## 2023-10-21 DIAGNOSIS — M9903 Segmental and somatic dysfunction of lumbar region: Secondary | ICD-10-CM | POA: Diagnosis not present

## 2023-10-21 DIAGNOSIS — M9902 Segmental and somatic dysfunction of thoracic region: Secondary | ICD-10-CM | POA: Diagnosis not present

## 2023-10-24 DIAGNOSIS — M9901 Segmental and somatic dysfunction of cervical region: Secondary | ICD-10-CM | POA: Diagnosis not present

## 2023-10-24 DIAGNOSIS — M9905 Segmental and somatic dysfunction of pelvic region: Secondary | ICD-10-CM | POA: Diagnosis not present

## 2023-10-24 DIAGNOSIS — M9903 Segmental and somatic dysfunction of lumbar region: Secondary | ICD-10-CM | POA: Diagnosis not present

## 2023-10-24 DIAGNOSIS — M9902 Segmental and somatic dysfunction of thoracic region: Secondary | ICD-10-CM | POA: Diagnosis not present

## 2023-10-26 DIAGNOSIS — M9905 Segmental and somatic dysfunction of pelvic region: Secondary | ICD-10-CM | POA: Diagnosis not present

## 2023-10-26 DIAGNOSIS — M9902 Segmental and somatic dysfunction of thoracic region: Secondary | ICD-10-CM | POA: Diagnosis not present

## 2023-10-26 DIAGNOSIS — M9903 Segmental and somatic dysfunction of lumbar region: Secondary | ICD-10-CM | POA: Diagnosis not present

## 2023-10-26 DIAGNOSIS — M9901 Segmental and somatic dysfunction of cervical region: Secondary | ICD-10-CM | POA: Diagnosis not present

## 2023-11-02 DIAGNOSIS — Z01419 Encounter for gynecological examination (general) (routine) without abnormal findings: Secondary | ICD-10-CM | POA: Diagnosis not present

## 2023-11-02 DIAGNOSIS — Z793 Long term (current) use of hormonal contraceptives: Secondary | ICD-10-CM | POA: Diagnosis not present

## 2023-11-10 DIAGNOSIS — Z1239 Encounter for other screening for malignant neoplasm of breast: Secondary | ICD-10-CM | POA: Diagnosis not present

## 2023-11-10 DIAGNOSIS — Z1231 Encounter for screening mammogram for malignant neoplasm of breast: Secondary | ICD-10-CM | POA: Diagnosis not present

## 2023-11-21 DIAGNOSIS — E7800 Pure hypercholesterolemia, unspecified: Secondary | ICD-10-CM | POA: Diagnosis not present

## 2023-11-21 DIAGNOSIS — J309 Allergic rhinitis, unspecified: Secondary | ICD-10-CM | POA: Diagnosis not present

## 2023-11-21 DIAGNOSIS — I7300 Raynaud's syndrome without gangrene: Secondary | ICD-10-CM | POA: Diagnosis not present

## 2023-11-21 DIAGNOSIS — F900 Attention-deficit hyperactivity disorder, predominantly inattentive type: Secondary | ICD-10-CM | POA: Diagnosis not present

## 2024-06-11 DIAGNOSIS — J4540 Moderate persistent asthma, uncomplicated: Secondary | ICD-10-CM | POA: Diagnosis not present

## 2024-06-11 DIAGNOSIS — F419 Anxiety disorder, unspecified: Secondary | ICD-10-CM | POA: Diagnosis not present

## 2024-06-11 DIAGNOSIS — Z0000 Encounter for general adult medical examination without abnormal findings: Secondary | ICD-10-CM | POA: Diagnosis not present

## 2024-06-11 DIAGNOSIS — F900 Attention-deficit hyperactivity disorder, predominantly inattentive type: Secondary | ICD-10-CM | POA: Diagnosis not present

## 2024-06-11 DIAGNOSIS — E7800 Pure hypercholesterolemia, unspecified: Secondary | ICD-10-CM | POA: Diagnosis not present

## 2024-06-11 DIAGNOSIS — Z23 Encounter for immunization: Secondary | ICD-10-CM | POA: Diagnosis not present
